# Patient Record
Sex: Female | Born: 1935 | Race: White | Hispanic: No
Health system: Southern US, Community
[De-identification: ages and names within clinical notes are randomized; demographics above are authoritative.]

---

## 2006-02-17 ENCOUNTER — Ambulatory Visit: Payer: Self-pay | Admitting: Unknown Physician Specialty

## 2006-05-26 ENCOUNTER — Ambulatory Visit: Payer: Self-pay | Admitting: Unknown Physician Specialty

## 2008-04-04 ENCOUNTER — Ambulatory Visit: Payer: Self-pay | Admitting: Unknown Physician Specialty

## 2014-01-25 ENCOUNTER — Inpatient Hospital Stay: Payer: Self-pay | Admitting: Internal Medicine

## 2014-01-25 LAB — CBC WITH DIFFERENTIAL/PLATELET
HCT: 35.8 % (ref 35.0–47.0)
HGB: 11.7 g/dL — ABNORMAL LOW (ref 12.0–16.0)
MCH: 29.4 pg (ref 26.0–34.0)
MCHC: 32.7 g/dL (ref 32.0–36.0)
MCV: 90 fL (ref 80–100)
PLATELETS: 227 10*3/uL (ref 150–440)
RBC: 3.98 10*6/uL (ref 3.80–5.20)
RDW: 13.9 % (ref 11.5–14.5)
WBC: 14.6 10*3/uL — AB (ref 3.6–11.0)

## 2014-01-25 LAB — BASIC METABOLIC PANEL
ANION GAP: 12 (ref 7–16)
BUN: 36 mg/dL — ABNORMAL HIGH (ref 7–18)
CREATININE: 2.16 mg/dL — AB (ref 0.60–1.30)
Calcium, Total: 8.7 mg/dL (ref 8.5–10.1)
Chloride: 108 mmol/L — ABNORMAL HIGH (ref 98–107)
Co2: 20 mmol/L — ABNORMAL LOW (ref 21–32)
EGFR (Non-African Amer.): 23 — ABNORMAL LOW
GFR CALC AF AMER: 28 — AB
Glucose: 152 mg/dL — ABNORMAL HIGH (ref 65–99)
OSMOLALITY: 291 (ref 275–301)
Potassium: 3.6 mmol/L (ref 3.5–5.1)
Sodium: 140 mmol/L (ref 136–145)

## 2014-01-25 LAB — URINALYSIS, COMPLETE
BILIRUBIN, UR: NEGATIVE
BLOOD: NEGATIVE
Bacteria: NONE SEEN
Glucose,UR: NEGATIVE mg/dL (ref 0–75)
Hyaline Cast: 11
Ketone: NEGATIVE
NITRITE: NEGATIVE
PH: 5 (ref 4.5–8.0)
PROTEIN: NEGATIVE
Specific Gravity: 1.02 (ref 1.003–1.030)
Squamous Epithelial: 7
WBC UR: 74 /HPF (ref 0–5)

## 2014-01-25 LAB — TROPONIN I

## 2014-01-25 LAB — HEPATIC FUNCTION PANEL A (ARMC)
ALBUMIN: 2.8 g/dL — AB (ref 3.4–5.0)
ALK PHOS: 73 U/L
ALT: 16 U/L
BILIRUBIN TOTAL: 0.5 mg/dL (ref 0.2–1.0)
Bilirubin, Direct: <0.1 mg/dL (ref 0.0–0.2)
SGOT(AST): 19 U/L (ref 15–37)
Total Protein: 6.5 g/dL (ref 6.4–8.2)

## 2014-01-26 LAB — CBC WITH DIFFERENTIAL/PLATELET
Basophil #: 0.1 10*3/uL (ref 0.0–0.1)
Basophil %: 0.5 %
Eosinophil #: 0.3 10*3/uL (ref 0.0–0.7)
Eosinophil %: 2.3 %
HCT: 30.9 % — AB (ref 35.0–47.0)
HGB: 10.1 g/dL — ABNORMAL LOW (ref 12.0–16.0)
LYMPHS PCT: 11 %
Lymphocyte #: 1.6 10*3/uL (ref 1.0–3.6)
MCH: 29 pg (ref 26.0–34.0)
MCHC: 32.7 g/dL (ref 32.0–36.0)
MCV: 89 fL (ref 80–100)
Monocyte #: 1.4 x10 3/mm — ABNORMAL HIGH (ref 0.2–0.9)
Monocyte %: 9.7 %
NEUTROS ABS: 10.9 10*3/uL — AB (ref 1.4–6.5)
Neutrophil %: 76.5 %
Platelet: 216 10*3/uL (ref 150–440)
RBC: 3.48 10*6/uL — AB (ref 3.80–5.20)
RDW: 13.7 % (ref 11.5–14.5)
WBC: 14.3 10*3/uL — AB (ref 3.6–11.0)

## 2014-01-26 LAB — COMPREHENSIVE METABOLIC PANEL
ALK PHOS: 58 U/L
AST: 14 U/L — AB (ref 15–37)
Albumin: 2.4 g/dL — ABNORMAL LOW (ref 3.4–5.0)
Anion Gap: 11 (ref 7–16)
BUN: 29 mg/dL — ABNORMAL HIGH (ref 7–18)
Bilirubin,Total: 0.3 mg/dL (ref 0.2–1.0)
CALCIUM: 7.5 mg/dL — AB (ref 8.5–10.1)
Chloride: 108 mmol/L — ABNORMAL HIGH (ref 98–107)
Co2: 21 mmol/L (ref 21–32)
Creatinine: 1.69 mg/dL — ABNORMAL HIGH (ref 0.60–1.30)
EGFR (African American): 37 — ABNORMAL LOW
GFR CALC NON AF AMER: 30 — AB
Glucose: 122 mg/dL — ABNORMAL HIGH (ref 65–99)
OSMOLALITY: 287 (ref 275–301)
POTASSIUM: 3.3 mmol/L — AB (ref 3.5–5.1)
SGPT (ALT): 11 U/L — ABNORMAL LOW
Sodium: 140 mmol/L (ref 136–145)
Total Protein: 5.4 g/dL — ABNORMAL LOW (ref 6.4–8.2)

## 2014-01-26 LAB — LIPID PANEL
CHOLESTEROL: 116 mg/dL (ref 0–200)
HDL Cholesterol: 27 mg/dL — ABNORMAL LOW (ref 40–60)
Ldl Cholesterol, Calc: 49 mg/dL (ref 0–100)
TRIGLYCERIDES: 199 mg/dL (ref 0–200)
VLDL Cholesterol, Calc: 40 mg/dL (ref 5–40)

## 2014-01-26 LAB — TSH: Thyroid Stimulating Horm: 1.58 u[IU]/mL

## 2014-01-26 LAB — MAGNESIUM: Magnesium: 1.4 mg/dL — ABNORMAL LOW

## 2014-01-26 LAB — HEMOGLOBIN A1C: Hemoglobin A1C: 8.2 % — ABNORMAL HIGH (ref 4.2–6.3)

## 2014-01-27 LAB — BASIC METABOLIC PANEL
Anion Gap: 8 (ref 7–16)
BUN: 22 mg/dL — ABNORMAL HIGH (ref 7–18)
Calcium, Total: 7.7 mg/dL — ABNORMAL LOW (ref 8.5–10.1)
Chloride: 114 mmol/L — ABNORMAL HIGH (ref 98–107)
Co2: 20 mmol/L — ABNORMAL LOW (ref 21–32)
Creatinine: 1.41 mg/dL — ABNORMAL HIGH (ref 0.60–1.30)
EGFR (Non-African Amer.): 37 — ABNORMAL LOW
GFR CALC AF AMER: 45 — AB
Glucose: 161 mg/dL — ABNORMAL HIGH (ref 65–99)
Osmolality: 290 (ref 275–301)
Potassium: 4.1 mmol/L (ref 3.5–5.1)
Sodium: 142 mmol/L (ref 136–145)

## 2014-01-27 LAB — URINE CULTURE

## 2014-01-28 ENCOUNTER — Encounter: Payer: Self-pay | Admitting: Internal Medicine

## 2014-01-28 LAB — BASIC METABOLIC PANEL
Anion Gap: 7 (ref 7–16)
BUN: 15 mg/dL (ref 7–18)
CALCIUM: 7.9 mg/dL — AB (ref 8.5–10.1)
Chloride: 115 mmol/L — ABNORMAL HIGH (ref 98–107)
Co2: 20 mmol/L — ABNORMAL LOW (ref 21–32)
Creatinine: 1.38 mg/dL — ABNORMAL HIGH (ref 0.60–1.30)
EGFR (African American): 47 — ABNORMAL LOW
EGFR (Non-African Amer.): 38 — ABNORMAL LOW
GLUCOSE: 251 mg/dL — AB (ref 65–99)
OSMOLALITY: 292 (ref 275–301)
POTASSIUM: 4.5 mmol/L (ref 3.5–5.1)
Sodium: 142 mmol/L (ref 136–145)

## 2014-02-13 LAB — COMPREHENSIVE METABOLIC PANEL
ALT: 17 U/L (ref 14–63)
Albumin: 2.1 g/dL — ABNORMAL LOW (ref 3.4–5.0)
Alkaline Phosphatase: 105 U/L (ref 46–116)
Anion Gap: 11 (ref 7–16)
BUN: 21 mg/dL — ABNORMAL HIGH (ref 7–18)
Bilirubin,Total: 0.4 mg/dL (ref 0.2–1.0)
CHLORIDE: 100 mmol/L (ref 98–107)
CO2: 24 mmol/L (ref 21–32)
CREATININE: 1.54 mg/dL — AB (ref 0.60–1.30)
Calcium, Total: 8.6 mg/dL (ref 8.5–10.1)
GFR CALC AF AMER: 41 — AB
GFR CALC NON AF AMER: 34 — AB
Glucose: 167 mg/dL — ABNORMAL HIGH (ref 65–99)
Osmolality: 277 (ref 275–301)
Potassium: 3.9 mmol/L (ref 3.5–5.1)
SGOT(AST): 30 U/L (ref 15–37)
Sodium: 135 mmol/L — ABNORMAL LOW (ref 136–145)
Total Protein: 6 g/dL — ABNORMAL LOW (ref 6.4–8.2)

## 2014-02-13 LAB — CBC WITH DIFFERENTIAL/PLATELET
BASOS ABS: 0.2 10*3/uL — AB (ref 0.0–0.1)
Basophil %: 0.6 %
Eosinophil #: 0.3 10*3/uL (ref 0.0–0.7)
Eosinophil %: 1 %
HCT: 32.5 % — ABNORMAL LOW (ref 35.0–47.0)
HGB: 10.6 g/dL — ABNORMAL LOW (ref 12.0–16.0)
Lymphocyte #: 1.2 10*3/uL (ref 1.0–3.6)
Lymphocyte %: 4.1 %
MCH: 28.1 pg (ref 26.0–34.0)
MCHC: 32.6 g/dL (ref 32.0–36.0)
MCV: 86 fL (ref 80–100)
Monocyte #: 2 x10 3/mm — ABNORMAL HIGH (ref 0.2–0.9)
Monocyte %: 6.5 %
NEUTROS ABS: 26.8 10*3/uL — AB (ref 1.4–6.5)
NEUTROS PCT: 87.8 %
PLATELETS: 278 10*3/uL (ref 150–440)
RBC: 3.77 10*6/uL — AB (ref 3.80–5.20)
RDW: 14.3 % (ref 11.5–14.5)
WBC: 30.6 10*3/uL — ABNORMAL HIGH (ref 3.6–11.0)

## 2014-02-13 LAB — URINALYSIS, COMPLETE
Bilirubin,UR: NEGATIVE
Blood: NEGATIVE
Glucose,UR: NEGATIVE mg/dL (ref 0–75)
Ketone: NEGATIVE
Nitrite: NEGATIVE
PH: 5 (ref 4.5–8.0)
Protein: NEGATIVE
RBC,UR: 21 /HPF (ref 0–5)
Specific Gravity: 1.018 (ref 1.003–1.030)
Squamous Epithelial: NONE SEEN
WBC UR: 13 /HPF (ref 0–5)

## 2014-02-13 LAB — TSH: Thyroid Stimulating Horm: 3.65 u[IU]/mL

## 2014-02-13 LAB — MAGNESIUM: Magnesium: 1.9 mg/dL

## 2014-02-15 LAB — URINE CULTURE

## 2014-02-18 ENCOUNTER — Encounter: Payer: Self-pay | Admitting: Internal Medicine

## 2014-02-18 ENCOUNTER — Ambulatory Visit: Payer: Self-pay | Admitting: Internal Medicine

## 2014-02-19 ENCOUNTER — Inpatient Hospital Stay: Payer: Self-pay | Admitting: Internal Medicine

## 2014-02-19 LAB — CBC WITH DIFFERENTIAL/PLATELET
BASOS ABS: 0.1 10*3/uL (ref 0.0–0.1)
Basophil %: 0.5 %
Eosinophil #: 0.3 10*3/uL (ref 0.0–0.7)
Eosinophil %: 1.1 %
HCT: 29.9 % — ABNORMAL LOW (ref 35.0–47.0)
HGB: 9.7 g/dL — ABNORMAL LOW (ref 12.0–16.0)
Lymphocyte #: 0.8 10*3/uL — ABNORMAL LOW (ref 1.0–3.6)
Lymphocyte %: 2.7 %
MCH: 27.6 pg (ref 26.0–34.0)
MCHC: 32.5 g/dL (ref 32.0–36.0)
MCV: 85 fL (ref 80–100)
MONO ABS: 1.8 x10 3/mm — AB (ref 0.2–0.9)
Monocyte %: 5.8 %
Neutrophil #: 27.2 10*3/uL — ABNORMAL HIGH (ref 1.4–6.5)
Neutrophil %: 89.9 %
PLATELETS: 247 10*3/uL (ref 150–440)
RBC: 3.53 10*6/uL — ABNORMAL LOW (ref 3.80–5.20)
RDW: 14.6 % — ABNORMAL HIGH (ref 11.5–14.5)
WBC: 30.3 10*3/uL — AB (ref 3.6–11.0)

## 2014-02-19 LAB — BASIC METABOLIC PANEL
Anion Gap: 10 (ref 7–16)
BUN: 29 mg/dL — ABNORMAL HIGH (ref 7–18)
CALCIUM: 8.2 mg/dL — AB (ref 8.5–10.1)
CO2: 23 mmol/L (ref 21–32)
Chloride: 104 mmol/L (ref 98–107)
Creatinine: 1.33 mg/dL — ABNORMAL HIGH (ref 0.60–1.30)
EGFR (African American): 49 — ABNORMAL LOW
GFR CALC NON AF AMER: 40 — AB
GLUCOSE: 226 mg/dL — AB (ref 65–99)
Osmolality: 287 (ref 275–301)
Potassium: 3.5 mmol/L (ref 3.5–5.1)
SODIUM: 137 mmol/L (ref 136–145)

## 2014-02-19 LAB — PROTIME-INR
INR: 1.2
PROTHROMBIN TIME: 15.6 s — AB

## 2014-02-21 LAB — CBC WITH DIFFERENTIAL/PLATELET
BASOS ABS: 0.2 10*3/uL — AB (ref 0.0–0.1)
Basophil %: 0.8 %
Eosinophil #: 0.4 10*3/uL (ref 0.0–0.7)
Eosinophil %: 1.3 %
HCT: 29.1 % — ABNORMAL LOW (ref 35.0–47.0)
HGB: 9.3 g/dL — ABNORMAL LOW (ref 12.0–16.0)
Lymphocyte #: 0.8 10*3/uL — ABNORMAL LOW (ref 1.0–3.6)
Lymphocyte %: 2.5 %
MCH: 27.3 pg (ref 26.0–34.0)
MCHC: 32 g/dL (ref 32.0–36.0)
MCV: 85 fL (ref 80–100)
Monocyte #: 1.9 x10 3/mm — ABNORMAL HIGH (ref 0.2–0.9)
Monocyte %: 6.1 %
NEUTROS PCT: 89.3 %
Neutrophil #: 28.2 10*3/uL — ABNORMAL HIGH (ref 1.4–6.5)
PLATELETS: 270 10*3/uL (ref 150–440)
RBC: 3.4 10*6/uL — ABNORMAL LOW (ref 3.80–5.20)
RDW: 14.5 % (ref 11.5–14.5)
WBC: 31.6 10*3/uL — AB (ref 3.6–11.0)

## 2014-02-21 LAB — BASIC METABOLIC PANEL
Anion Gap: 8 (ref 7–16)
BUN: 18 mg/dL (ref 7–18)
CHLORIDE: 107 mmol/L (ref 98–107)
Calcium, Total: 8.2 mg/dL — ABNORMAL LOW (ref 8.5–10.1)
Co2: 24 mmol/L (ref 21–32)
Creatinine: 1.12 mg/dL (ref 0.60–1.30)
EGFR (African American): 59 — ABNORMAL LOW
EGFR (Non-African Amer.): 49 — ABNORMAL LOW
GLUCOSE: 90 mg/dL (ref 65–99)
OSMOLALITY: 279 (ref 275–301)
POTASSIUM: 3.4 mmol/L — AB (ref 3.5–5.1)
Sodium: 139 mmol/L (ref 136–145)

## 2014-02-21 LAB — PROTEIN, BODY FLUID: Protein, Body Fluid: 2.6 g/dL

## 2014-02-21 LAB — LACTATE DEHYDROGENASE, PLEURAL OR PERITONEAL FLUID: LDH, BODY FLUID: 192 U/L

## 2014-02-21 LAB — GLUCOSE, SEROUS FLUID: Glucose, Body Fluid: 196 mg/dL

## 2014-02-21 LAB — ALBUMIN: Albumin: 1.5 g/dL — ABNORMAL LOW (ref 3.4–5.0)

## 2014-02-21 LAB — ALBUMIN, FLUID (OTHER): Body Fluid Albumin: 1.3 g/dL

## 2014-02-21 LAB — LACTATE DEHYDROGENASE: LDH: 369 U/L — ABNORMAL HIGH (ref 81–246)

## 2014-02-22 LAB — CBC WITH DIFFERENTIAL/PLATELET
Bands: 1 %
HCT: 31.5 % — ABNORMAL LOW (ref 35.0–47.0)
HGB: 9.9 g/dL — ABNORMAL LOW (ref 12.0–16.0)
Lymphocytes: 1 %
MCH: 27.1 pg (ref 26.0–34.0)
MCHC: 31.5 g/dL — AB (ref 32.0–36.0)
MCV: 86 fL (ref 80–100)
Monocytes: 4 %
Platelet: 315 10*3/uL (ref 150–440)
RBC: 3.66 10*6/uL — AB (ref 3.80–5.20)
RDW: 15 % — ABNORMAL HIGH (ref 11.5–14.5)
SEGMENTED NEUTROPHILS: 94 %
WBC: 39 10*3/uL — ABNORMAL HIGH (ref 3.6–11.0)

## 2014-02-23 LAB — WBC: WBC: 37.5 10*3/uL — ABNORMAL HIGH (ref 3.6–11.0)

## 2014-02-23 LAB — HEPATIC FUNCTION PANEL A (ARMC)
ALBUMIN: 1.6 g/dL — AB (ref 3.4–5.0)
ALT: 12 U/L — AB (ref 14–63)
Alkaline Phosphatase: 123 U/L — ABNORMAL HIGH (ref 46–116)
BILIRUBIN TOTAL: 0.2 mg/dL (ref 0.2–1.0)
Bilirubin, Direct: <0.1 mg/dL (ref 0.0–0.2)
SGOT(AST): 24 U/L (ref 15–37)
Total Protein: 5.2 g/dL — ABNORMAL LOW (ref 6.4–8.2)

## 2014-02-24 LAB — CLOSTRIDIUM DIFFICILE(ARMC)

## 2014-02-24 LAB — CULTURE, BLOOD (SINGLE)

## 2014-02-25 LAB — CEA: CEA: 12.1 ng/mL — ABNORMAL HIGH (ref 0.0–4.7)

## 2014-02-25 LAB — BODY FLUID CULTURE

## 2014-03-19 ENCOUNTER — Ambulatory Visit
Admit: 2014-03-19 | Disposition: A | Discharge status: Home / Self Care | Payer: Self-pay | Attending: Internal Medicine | Admitting: Internal Medicine

## 2014-03-19 ENCOUNTER — Encounter
Admit: 2014-03-19 | Disposition: A | Discharge status: Home / Self Care | Payer: Self-pay | Attending: Internal Medicine | Admitting: Internal Medicine

## 2014-03-19 DEATH — deceased

## 2014-04-19 ENCOUNTER — Encounter
Admit: 2014-04-19 | Disposition: A | Discharge status: Home / Self Care | Payer: Self-pay | Attending: Internal Medicine | Admitting: Internal Medicine

## 2014-05-19 NOTE — Consult Note (Signed)
PATIENT NAME:  Amanda [name], BOKHARI MR#:  [id number] DATE OF BIRTH:  [birth year]  DATE OF CONSULTATION:  02/22/2014  CONSULTING PHYSICIAN:  Simonne Come. Inez Pilgrim, MD  Amanda [name] is an 81 year old patient who was admitted on February 2 and seen and evaluated by me on February 5. Consultation done on that day discussed with medicine at that time, met with a family member at that time, and had written note placed on the chart at that time, although this full narrative reflecting the events of that day is rendered after a delay.   Amanda [name] had recently been admitted January 8 to January 11 for urinary tract infection, acute renal failure. She has a past history that includes dementia, diabetes, and hypertension. She was in Dr. Marella Bile office for increasing shortness of breath and a recent CT showed a large left-sided pleural effusion with a left perihilar mass. The patient had complained of fatigue. Due to dementia, the patient has not been able to give much of her history, otherwise provided by physicians and family members. The patient again was admitted on February 2 and plans were for thoracentesis on the left side, pulmonary consultation. The patient had leukocytosis and was treated for potential postobstructive pneumonia, recent acute on chronic renal failure, was given IV fluids and monitoring of diabetes with sliding scale insulin. She was continued on other regular medicines for cholesterol and for dementia.   MEDICATIONS AT THE TIME OF ADMISSION: Vitamin D and vitamin B12, raloxifene 60 mg p.o. daily, omeprazole 20 daily, Namenda 14 mg daily, magnesium 400 mg b.i.d., Januvia  50 mg in the evening, glimepiride 4 mg daily, escitalopram 5 mg daily,  atorvastatin 80 mg daily, amlodipine 5 mg daily. Medication taken from the admission physician's list.   SOCIAL HISTORY: Was at [facility] in rehabilitation. History of smoking, pack-years were unknown.   FAMILY HISTORY: Noncontributory.   REVIEW OF SYSTEMS: The  patient did state that she had some left-sided chest pain and indicated that area where she had a previous thoracentesis on February 3. She denied chest pain. Denied headache or abdominal pain. She admitted to being short of breath. Denied vomiting, but said she had loose stool.   PHYSICAL EXAMINATION: GENERAL: She was lethargic, alert, had memory lapses, could answer the questions as above, could not give an account for her current difficulties.  HEENT: Sclerae, no jaundice. Mouth, no thrush.  LUNGS: Decreased air entry in both bases. No wheezing.  HEART: Regular.  ABDOMEN: Nontender. No palpable mass or organomegaly.  NEUROLOGIC: Mental status as above, not grossly focal. Moving all extremities. I did not test the gait.  EXTREMITIES: Bilateral symmetric pitting edema.   LABORATORY DATA: On February 2 admission white count was 30,000, 27,000 neutrophils, hemoglobin 9.7, platelets 247,000. The creatinine was 1.33, the calcium was 8.2. Pleural fluid cytology was pending at that time, but later, from February 2 sample, was negative for malignant cells. A February 4 LDH was 369. February 5 repeat white count was up to 39,000, hemoglobin 9.9, platelets 315,000. Liver chemistries were ordered for the following day. Later, returned with hypoalbuminemia of 1.6. Otherwise unremarkable liver function tests.  IMAGING: A CT scan had been reported, scan done on February 1 without contrast, it showed suspicion for a left hilar central mass with obstruction and collapse of left upper lobe, a lot of pleural fluid, partially loculated anteriorly, small right effusion, 8 mm right lower lobe pulmonary nodule, and a nonspecific 19 mm right adrenal nodule as well. Bulky mediastinal adenopathy.  CT scan from January 8 showed a 2.3 cm right renal lesion with retroperitoneal hemorrhage and also showed the same right lower lobe 8 mm lung nodule.   IMPRESSION AND PLAN: The patient with a lung mass that may be matted nodes,  highly suspicious for possibly lymphoma or lung cancer. Other histologies are not ruled out. There is a   renal mass that may be incidental, a second primary, not the usual pattern for metastatic renal cancer, although there was 1 isolated pulmonary nodule. The patient has pleural effusion that was initially not obviously malignant. It may postobstructive pneumonia. There is collapsed lung. There is a leukocytosis that is probably reactive or due to infection or leukemoid reaction, less likely would be myeloproliferative disease or chronic myelogenous leukemia. There is anemia, likely directly related to malignancy, and to azotemia, possibly contribution. Certainly could be other causes, underlying iron deficiency though this is not the pattern of a gastrointestinal malignancy. The patient's condition is poor. She is oxygen-dependent and there is an underlying dementia that has limited her ability to participate in her own decision-making. I spoke to the patient's son at the bedside about all these issues. Essentially, there is still appropriate therapies and the potential for some successful palliation and with lung cancer, for small cell cancer, we can give less than full dose therapy and expect a very good response even in some very ill patients.  Lymphoma is highly treatable, even if the patient could not take full course of therapy, could not effect a cure, the potential to palliate with very moderate doses and steroids exist. The patient's son at this time wanted to pursue a tissue diagnosis later depending on the results to decide if given the patient's comorbidities, he would want to pursue aggressive therapy. Therefore, the plan was pending repeat thoracentesis or bronchoscopy versus CT-guided biopsy would be pursued. After tissue diagnosis, re-evaluate the potential therapies. I ordered a  CLL panel. Watch serial CBC and chemistries. A CEA was checked.    ____________________________ Simonne Come Inez Pilgrim,  MD rgg:LT D: 03/17/2014 17:25:00 ET T: 03/17/2014 20:32:26 ET JOB#: [id number]  cc: Simonne Come. Inez Pilgrim, MD, <Dictator> Dallas Schimke MD ELECTRONICALLY SIGNED 03/26/2014 21:48

## 2014-05-19 NOTE — Consult Note (Signed)
Comments   I met with pt's daughter. She and her brother have decided for pt to go to the [facility] for end of life care. Ginny Ward, RN, liason for the [facility] notified and will see pt in AM.   Electronic Signatures: Felesia Stahlecker, Izora Gala (MD)  (Signed 11-Feb-16 15:42)  Authored: Palliative Care   Last Updated: 11-Feb-16 15:42 by Rakim Moone, Izora Gala (MD)

## 2014-05-19 NOTE — H&P (Signed)
PATIENT NAME:  Amanda [name], [name] MR#:  [id number] DATE OF BIRTH:  [birth year]  DATE OF ADMISSION:  01/25/2014  PRIMARY CARE PHYSICIAN: Elnita Maxwellheryl L. Lin GivensJeffries, MD.  REFERRING EMERGENCY ROOM PHYSICIAN: Governor Rooksebecca Lord, MD.  CHIEF COMPLAINT: Generalized weakness, diarrhea, urinary frequency.   HISTORY OF PRESENT ILLNESS: The patient is an 81 year old, pleasant, Caucasian female with a past medical history of diabetes mellitus and hyperlipidemia, who is coming into the ED with a chief complaint of generalized weakness. Her UA resulted with 3+ leukocyte esterase. WBC 14.6 and creatinine is 2.16. The patient was complaining of intermittent episodes of diarrhea, regarding which a CAT scan of the abdomen was ordered, which has revealed right upper pole renal mass with a 2.3 cm hematoma and pulmonary nodule. Urine cultures were obtained. The patient was started on IV Rocephin and the hospitalist team is called to admit the patient. During my examination, the patient is resting comfortable, denies any abdominal pain and she reports no diarrhea. She has chronic history of diarrhea intermittently. She gets 2 or 3 loose bowel movements and after that it will be resolved spontaneously and she is not concerned. Son is at bedside. She denies any back pain flank pain. No similar complaints in the past.   PAST MEDICAL HISTORY: Diabetes mellitus, hyperlipidemia.   PAST SURGICAL HISTORY: None.   ALLERGIES: No known drug allergies.   PSYCHOSOCIAL HISTORY: Lives at home alone. No smoking, alcohol or illicit drug usage.   FAMILY HISTORY: Diabetes runs in her family.   HOME MEDICATIONS: Vitamin D2 - 50,000 international units 1 capsule p.o. once a week, vitamin B12 - 500 mcg once daily, raloxifene 60 mg 1 tablet p.o. once daily, omeprazole 20 mg capsule once daily, Namenda 40 mg p.o. once daily, lisinopril 10 mg p.o. once daily, Januvia 50 mg 1 tablet p.o. once daily, glimepiride 4 mg p.o. once daily, fish oil 1000 mg 1 capsule  p.o. once daily, escitalopram 5 mg once daily, doxycycline 1 capsule p.o. once daily, donepezil 10 mg p.o. once daily, atorvastatin 80 mg p.o. once a day, aspirin 81 mg p.o. once a day.  REVIEW OF SYSTEMS:  CONSTITUTIONAL: Denies any fever or fatigue. Complaining of weakness. Denies any weight loss or weight gain.   EYES: Denies blurry vision, double vision.   ENT: Denies epistaxis, discharge.   RESPIRATORY: Denies cough, COPD.   CARDIOVASCULAR: No chest pain or palpitations.   GASTROINTESTINAL: Denies jaundice. No nausea, vomiting. Had 2 to 3 loose bowel movements which is her normal. Denies any rectal bleeding.   GENITOURINARY: Complaining of dysuria. No hematuria. Denies any renal calculi or urinary frequency. Denies any breast mass or vaginal discharge.   ENDOCRINE: Denies polyuria, nocturia. Has diabetes mellitus. No thyroid problems.   HEMATOLOGIC: No any anemia, easy bruising or bleeding.   INTEGUMENT: Denies acne, rash, lesions.   MUSCULOSKELETAL: No joint pain in her neck, back, shoulders. Denies any gout.   NEUROLOGIC: Denies vertigo, ataxia. No dementia.  PSYCHIATRIC: No ADD or OCD.  PHYSICAL EXAMINATION: VITAL SIGNS: Temperature 98.2, pulse 75, respirations 20, blood pressure is 101/50, pulse oximetry 97%.   GENERAL APPEARANCE: Not in any acute distress. Moderately built and nourished.   HEENT: Normocephalic, atraumatic. Pupils are equally reacting to light and accommodation. No scleral icterus. No conjunctival injection. No sinus tenderness. No postnasal drip. Moist mucous membranes.   NECK: Supple. No JVD. No thyromegaly. Range of motion is intact.   LUNGS: Clear to auscultation bilaterally. No accessory muscle use and no anterior chest wall  tenderness on palpation.   CARDIAC: S1, S2 normal. Regular rate and rhythm. No murmurs.   GASTROINTESTINAL: Soft. Bowel sounds are present in all 4 quadrants. Nontender, nondistended. No masses felt. No flank  tenderness.  NEUROLOGIC: Awake, alert and oriented x 3. Cranial nerves 2 through 12 is grossly intact. Motor and sensory are intact. Reflexes are 2+.   EXTREMITIES: No edema. No cyanosis. No clubbing.   SKIN: Warm to touch. Normal turgor. No rashes. No lesions.   MUSCULOSKELETAL: No joint effusion.   PSYCHIATRIC: Normal mood and affect.   LABORATORY AND IMAGING STUDIES: Total protein 6.5, albumin 2.8. The rest of the LFTs are normal. Troponin less than 0.02. WBC 14.6, hemoglobin 11.7, hematocrit 35.8, platelets are 227,000. Urinalysis: Urine is cloudy in appearance. Leukocyte esterase 3+, nitrites negative, hyaline casts are present. BMP: Glucose 152, BUN 36, creatinine 2.16. We do not know her baseline creatinine as no previous lab results are present at [facility]. Sodium 140, potassium 3.6, chloride 108, CO2 of 20. GFR 23. Anion gap is 12. Serum osmolality and calcium are normal.   A 12-lead EKG: Normal sinus rhythm at 69 beats per minute. CAT scan of the abdomen and pelvis without contrast reveals a retroperitoneal hemorrhage adjacent to a 2.3 cm upper pole right renal lesion. Further evaluation, postcontrast MRI or CT should be considered. A 0.8 mm right lower lobe pulmonary nodule, consider CT chest. Atherosclerosis including aortoiliac and coronary artery disease, consider assessment for potential risk factor modification. Dietary therapy and pharmacological therapy may be warranted if clinically indicated.   ASSESSMENT AND PLAN: An 81 year old, Caucasian female brought into the emergency department for generalized weakness, diagnosed with urinary tract infection and right upper pole kidney mass with hematoma.  1. Generalized weakness, probably from acute cystitis. Urine cultures are ordered. The patient will be given IV Rocephin and she will be placed on IV fluids.  2. Acute kidney injury. The patient denies any chronic renal insufficiency. This can be prerenal with a component of renal. I will  give her gentle hydration with IV fluids. Avoid nephrotoxins, hold lisinopril and diabetic medications. Consult is placed to Dr. Wynelle Link.  3. Right upper pole kidney mass and hematoma 2.3 cm. We will get MRI of the abdomen for further evaluation of the right upper pole kidney mass with hematoma. If needed, we will consider urology consult. The patient denies any flank pain, weight loss or weight gain.  4. Right lower lung pulmonary nodule. The patient needs to get a CT of the chest, which can be done as an outpatient.  5. Diabetes mellitus. The patient will be on sliding scale insulin. We will get hemoglobin A1c. She seems to be noncompliant. Will hold off on her home medications at this time.  6. Hyperlipidemia. Continue statin.  7. We will provide gastrointestinal prophylaxis and deep vein thrombosis prophylaxis with sequential compression devices in view of right upper pole right upper pole kidney mass with hematoma.  8. The diagnosis and plan of care was discussed in detail with the patient and her son at bedside. They both verbalized understanding of the plan.   Total time spent is 50 minutes.     ____________________________ Ramonita Lab, MD ag:TT D: 01/25/2014 18:06:34 ET T: 01/25/2014 19:06:51 ET JOB#: [id number]  cc: Ramonita Lab, MD, <Dictator> Ramonita Lab MD ELECTRONICALLY SIGNED 02/02/2014 15:19

## 2014-05-19 NOTE — Discharge Summary (Signed)
PATIENT NAME:  Amanda [name] MR#:  [id number] DATE OF BIRTH:  11-Aug-1924  DATE OF ADMISSION:  01/25/2014 DATE OF DISCHARGE:  01/28/2014  PRESENTING COMPLAINT: Increasing weakness.   DISCHARGE DIAGNOSES: 1.  Acute renal failure, appears acute on chronic renal failure.  2.  Urinary tract infection.  3.  Right upper pole 2.3 cm renal mass. Work-up needs to be done as outpatient.  4.  Hypertension.  5.  Dementia.  6.  Incidental note of right lower lobe 8 mm lung nodule.  CONDITION ON DISCHARGE: Fair.   CODE STATUS: FULL code.   DISCHARGE DIET: 2 grams sodium diet, ADA 1800 calorie diet.   DISCHARGE MEDICATIONS: 1.  Vitamin B12 500 mcg p.o. daily. 2.  Escitalopram 5 mg daily.  3.  Fish oil 1000 mg daily.  4.  Donepezil 10 mg daily.  5.  Januvia 50 mg p.o. daily.  6.  Namenda XR 14 mg p.o. daily.  7.  Glimepiride 4 mg daily.  8.  Atorvastatin 80 mg daily.  9.  Omeprazole 20 mg p.o. daily.  10.  Vitamin D2 50,000 international units p.o. once a week on Sunday.  11.  Raloxifene 60 mg p.o. daily.  12.  Amlodipine 5 mg daily.  13.  Keflex 500 mg b.i.d.  14.  Mag-Ox 400 mg b.i.d. for 5 days.  NOTE:  1.  Lisinopril has been discontinued.  2.  Aspirin has been held since the patient has some hemorrhagic area around the right renal upper pole mass.   DISCHARGE INSTRUCTIONS:  1.  [facility] Lateef[facility] to 2 weeks for acute on chronic kidney disease along with right upper pole renal mass.  2.  Follow up with Dr. Harrington Challengerhies after rehab is completed to get CT scan of the lungs to follow up on the lung nodule and hospital follow-up.   CONSULTANTS: Renal, Dr. Wynelle LinkKolluru.   DIAGNOSTIC DATA: Labs at discharge: Creatinine is 1.38. potassium is 4.1. Chloride is 114. White count is 14.3. H and H is 10.1 and 30.9. Lipid profile within normal limits. Hemoglobin A1c is 8.2. UA positive for UTI. Urine culture: 50,000 colonies of Streptococcus  agalactiae. Creatinine on admission was 2.16.  BRIEF SUMMARY OF HOSPITAL COURSE: Amanda [name] is a pleasant 81 year old Caucasian female with hypertension, diabetes, and dementia who comes in with: 1.  Generalized weakness from probably acute cystitis and renal failure. Her urine cultures grew Streptococcus agalactiae. She was started on IV Rocephin, will change to p.o. Keflex for the next couple of days.  2.  Acute kidney injury, likely has CKD at baseline. Given her IV fluids, held her lisinopril, consulted Dr. Wynelle LinkKolluru. Creatinine came down from 2.16 to 1.3.  3.  Right upper pole mass with hematoma, 2.3 cm. MRI of the abdomen will be done at a later date. Given elevated creatinine, not advisable to give IV contrast. Family is aware and will get the patient to follow up with nephrology once she is out of rehab.  4.  Right lower lobe pulmonary nodule. CT of the lungs will be done as outpatient. We will defer this to primary care physician, Dr. Harrington Challengerhies. The patient's son is aware about it. 5.  Type 2 diabetes. Resumed glimepiride and Januvia.  6.  Hyperlipidemia. Continued statins.  7.  DVT prophylaxis. SCDs and TEDs. Antiplatelets were avoided given hematoma of the right upper pole kidney.  Hospital stay otherwise remained stable. Physical therapy recommended rehab. The patient is  going to [facility] today. She remained a FULL code.   TIME SPENT: 40 minutes.   ____________________________ Wylie Hail Allena Katz, MD sap:sb D: 01/28/2014 11:19:42 ET T: 01/28/2014 11:48:36 ET JOB#: [id number]  cc: Jastin Fore A. Allena Katz, MD, <Dictator> Willow Ora MD ELECTRONICALLY SIGNED 01/31/2014 11:20

## 2014-05-19 NOTE — Discharge Summary (Signed)
PATIENT NAME:  Amanda Amanda [name], Amanda Amanda [name] MR#:  [id number] DATE OF BIRTH:  [birth year]  DATE OF ADMISSION:  02/19/2014 DATE OF DISCHARGE:  03/01/2014  PRIMARY CARE PHYSICIAN:  Dr. Harrington Challengerhies.    FINAL DIAGNOSES:  1.  Acute respiratory failure.  2.  Left lung mass with pleural effusion with Amanda [name] reaccumulation over Amanda [name] short period of time, leukocytosis and hilar adenopathy.  3.  Possible postobstructive pneumonia.  4.  Chronic kidney disease.  5.  Hypertension.  6.  Diabetes.  7.  Dementia.    MEDICATIONS ON DISCHARGE TO THE HOSPICE HOME: Include Lexapro 5 mg daily, morphine 20 mg/mL 0.25 mL every 2 hours as needed for shortness of breath, nystatin 1000 units mL, 5 mL orally q. 6 hours, DuoNeb nebulizer solution 3 mL inhaled 4 times Amanda [name] day as needed for shortness of breath, Glucerna shake 230 mL 3 times Amanda [name] day, lorazepam 0.5 mg orally every 4 hours as needed for agitation. All of her other medications were stopped.     HOSPITAL COURSE:  The patient was admitted February 2 with increasing shortness of breath, abnormal CAT scan, found to have left-sided pleural effusion, left perihilar mass, leukocytosis. The patient was started on antibiotics.   LABORATORY AND RADIOLOGICAL DATA DURING THE HOSPITAL COURSE: Included creatinine of 1.33, BUN 24, sodium 137, potassium 3.5, chloride 104, CO2 of 23, calcium 8.2, INR 1.2. White blood count 30.3, H and H 9.7 and 29.9, platelet count of 247,000. Pleural fluid cytology negative, no organisms seen, pH body fluid 7.2, LDH 192, glucose 196, albumin 1.3 in the body fluid, protein in the body fluid of 2.6. The patient had Amanda [name] thoracentesis on February 2 that drew off 850 mL. Blood cultures were negative. CEA was elevated at 12.1. Stool for Clostridium difficile was negative. The patient had another thoracentesis on February 8 that drew off another 800 mL. Creatinine upon discharge 1.57. White count upon discharge 40.2, hemoglobin 9.9.   CONSULTANTS DURING THE HOSPITAL COURSE: Included Dr.  Welton FlakesKhan pulmonology, then Dr. Belia HemanKasa pulmonology, physical therapy, Dr. Lorre NickGittin oncology, and  palliative care team.    1.  The patient was made Amanda [name] DNR. Decision was made not to proceed with tissue diagnosis and comfort care measures at the hospice home were pursued. It took Amanda [name] long time to make that decision, but it was done on 03/01/2014. The patient was transferred to the hospice home.  2.  The patient for acute respiratory failure was on 6 liters nasal cannula the entire hospital stay, had 2 thoracenteses drawing out 850 mL and then 800 mL.  3.  For Amanda [name] left lung mass with pleural effusion that reaccumulated over Amanda [name] short period of time, leukocytosis, and hilar adenopathy, likely this is Amanda [name] lung cancer, but also could be Amanda [name] lymphoma. Overall prognosis poor. The patient was Amanda [name] DNR and transferred to the hospice home.  4.  Possible postobstructive pneumonia. The patient was on antibiotics during the hospital course.   5.  For chronic kidney disease stage III, no change over the hospital course.  6.  For hypertension, medications were stopped upon discharge to the hospice home.  7.  For her type 2 diabetes, medications were stopped on transfer to the hospice home. Sugars were high secondary to the steroids.  8.  Dementia. Medications were stopped on the hospice home.   TIME SPENT ON DISCHARGE: 35 minutes.     ____________________________ Herschell Dimesichard J. Renae GlossWieting, MD rjw:bu D: 03/01/2014 15:33:30 ET T: 03/01/2014 15:52:54 ET JOB#: [id number]  cc: Gerlene Burdockichard  Jeanella Anton, MD, <Dictator> Neomia Dear. Harrington Challenger, MD Salley Scarlet MD ELECTRONICALLY SIGNED 03/11/2014 10:18

## 2014-05-19 NOTE — Consult Note (Signed)
Brief Consult Note: Diagnosis: lung mass/adenopathy, renal cystic mass, retroperitoneal hemorrhage, anemia, sever hypoalbuminemia, dementia.   Patient was seen by consultant.   Orders entered.   Comments: DICTATED NOTE TO FOLLOW PATIENT SEEN DISCUSSED WITH SON.  BRIEFLY..LUNG CANCER OR LYMPHOMA MOST LIKELY. CURRENT PLAN FOR DX, BRONCHOSCOPY. ADDITIONAL IMAGING, POSSIBLE PET/CT OR MRI BRAIN DEPENDING ON DX, AND IF PATIENT AND FAMILY WANT TO PURSUE TX.  IF NO RESP DISTRESS, WOULD REDUCE OR D/C STEROIDS, AS MAY INTERFERE WITH DIASGNOSIS OF LYMPHOMA.  Electronic Signatures: Marin RobertsGittin, Robert G (MD)  (Signed [phone]) [phone]-Feb-16 20:24)  Authored: Brief Consult Note   Last Updated: 05-Feb-16 20:24 by Marin RobertsGittin, Robert G (MD)

## 2014-05-19 NOTE — H&P (Signed)
PATIENT NAME:  Amanda [name], Amanda [name] MR#:  [id number] DATE OF BIRTH:  02-Jul-1924  DATE OF ADMISSION:  02/19/2014  PRIMARY CARE PHYSICIAN:  Neomia Dearavid N. Harrington Challengerhies, MD  PRIMARY PULMONOLOGIST:  Yevonne PaxSaadat [name]. Khan, MD  CHIEF COMPLAINT: Increasing shortness of breath and abnormal CT scan.   HISTORY OF PRESENT ILLNESS: History is obtained from the patient's daughter and son along with Dr. Welton FlakesKhan. The patient is not able to provide much history secondary to her underlying dementia.   Amanda [name] is an 81 year old Caucasian female with past medical history of hypertension, dementia, was recently admitted 01/25/2014, [facility].  Come in from Dr. Milta DeitersKhan's office for increasing shortness of breath and recent CT of the chest done which shows large malignant left-sided pleural effusion with possible left perihilar mass. The patient is, herself, not able to provide much history. She only states that she is tired and fatigued. Her daughter tells me that patient has been [facility], she had received some IV fluids for symptoms of dehydration and she has been progressively declining with poor p.o. intake and feeling weak, fatigued, and shortness of breath. [name] followup chest [facility] and it showed pleural effusion and, hence, she was referred to Dr. Welton FlakesKhan who did [name] CT of the chest which shows large malignant pleural effusion. The patient is being admitted for further evaluation and management.   PAST MEDICAL HISTORY: 1.  Dementia.  2.  Recent admission for acute renal failure and UTI. 3.  Diabetes.  4.  Hypertension.   CODE STATUS: Full code. This was discussed with the patient's daughter and son.   CURRENT MEDICATIONS: 1.  Vitamin D2, 50,000 International Units p.o. once [name] week.  2.  Vitamin B12, 500 mcg p.o. daily.  3.  Raloxifene 60 mg p.o. daily.  4.  Omeprazole 20 mg daily.  5.  Namenda XR 14 mg p.o. daily.  6.  Magnesium oxide 400 mg b.i.d.  7.  Januvia  50 mg p.o. daily in the evening.  8.  Glimepiride 4 mg p.o. daily.  9.  Fish oil 1000 mg p.o. daily.  10.  Escitalopram 5 mg p.o. daily.  11.  Donepezil 10 mg p.o. daily.  12.  Atorvastatin 80 mg daily.  13.  Amlodipine 5 mg daily.   SOCIAL HISTORY: She is currently at rehabilitation at [name].  She is an ex-smoker according of her daughter. Further details not known.   PAST SURGICAL HISTORY: None.   FAMILY HISTORY: Diabetes runs in the family.   REVIEW OF SYSTEMS:  Limited secondary to patient's dementia.  CONSTITUTIONAL: Positive for some fatigue and weakness.  EYES: No blurred or double vision, glaucoma, or cataracts.  EARS, NOSE, THROAT: No tinnitus, ear pain, ear discharge.  RESPIRATORY: Positive for shortness of breath, orthopnea, and PND.  CARDIOVASCULAR: No chest pain. Positive for leg edema. Positive for hypertension and.  GASTROINTESTINAL: No nausea, vomiting, or abdominal pain. Positive for diarrhea.  GENITOURINARY: No dysuria, hematuria, frequency.  ENDOCRINE: No polyuria, nocturia, or thyroid problems.  HEMATOLOGY: No anemia or easy bruising.  SKIN: No acne or rash.  MUSCULOSKELETAL: No swelling or gout.  NEUROLOGIC: Positive for dementia. No CVA, TIA, or seizures.  PSYCHIATRIC: No anxiety or depression. All other systems reviewed and negative.   PHYSICAL EXAMINATION: GENERAL: The patient is alert. She is oriented x 2. She appears very weak and frail.  VITAL SIGNS:  Afebrile. Pulse is 106. Blood pressure is 161/73. Saturations are 92% at  rest.  HEENT: Atraumatic, normocephalic. PERRLA, EOM intact. Oral mucosa is dry.   NECK: Supple. No JVD. No carotid bruit.  RESPIRATORY: Decreased breath sounds at the bases, more on the left than the right, no audible rales or any respiratory distress.  CARDIOVASCULAR: Both heart sounds are normal. Rate, rhythm regular. PMI not lateralized. Chest nontender.  EXTREMITIES: There is 2+ pitting edema. Feeble pedal pulses, good femoral  pulses.  ABDOMEN: Soft, benign, nontender. No organomegaly. NEUROLOGIC: Exam limited, but appears grossly nonfocal. Mood, affect normal.  PSYCHIATRIC: The patient is awake. She is alert and oriented x 2.  SKIN:  Warm and dry.  LABORATORY DATA: White count is 30,000, H and H is 9.7 and 29.9, platelet count is 247,000. PT/INR is 15.6 and 1.2.  IMAGING: CT of the chest without contrast done on February 1 shows [name] large left pleural effusion, partially loculated, over the anterior left upper lobes. Small right pleural effusion. There is collapse of the left upper lobe with concern for central left hilar mass, difficult to evaluate with collapsed lung and lack of IV contrast. There is bulky mediastinal lymphadenopathy. [name] left   large pretracheal and left paratracheal nodal mass, it measures about 4.7 x 3.7 noted. Chest wall tissues are unremarkable. Pulmonary nodule in the right lower lobe measures 8 mm. Heart size is normal. Suspicion for left hilar central mass with obstruction and collapse of the left upper lobe, large left pleural effusion, partially loculated, anterior in the left upper hemithorax.  Bulky mediastinal lymphadenopathy. [name] 19 mm right adrenal nodule, nonspecific, cannot exclude metastasis, severe coronary artery disease.   ASSESSMENT: Amanda [name], an 81 year old, with history hypertension, diabetes, and dementia, comes in with: 1.  Large malignant left-sided pleural effusion with left perihilar mass, likely malignancy. The patient is going to be admitted to the medical floor. Will give oxygen. We will request interventional radiology to do therapeutic and diagnostic left-sided thoracentesis. Pulmonary consultation with Dr. Welton Flakes. Case was discussed with him. Further workup regarding left perihilar mass per Dr. Welton Flakes.  2.  Leukocytosis, with significant left pleural effusion and hilar mass. Could be [name] postobstructive pneumonia. I will cover her with antibiotics with intravenous Rocephin and  Zithromax. Will send sputum culture if patient able to give [name] sample. I will also send in blood cultures. Follow up leukocyte count. 3.  Acute on chronic renal insufficiency. The patient's creatinine is still pending. She was recently admitted with dehydration and had received fluid. Her last creatinine was 1.5. Will await metabolic panel, continue intravenous fluids for now. We will continue normal saline at maintenance rate for now.  4.  Hypertension. Will resume her amlodipine.  5.  Type 2 diabetes. Continue sliding scale for now. The patient has been having poor p.o. intake. We will resume her diabetic medications as her blood sugars are managed and according to her p.o. intake.  6.  Gastroesophageal reflux disease. Continue omeprazole. 7.  Dementia. Continue Namenda and donepezil.  8.  Hyperlipidemia, on statins.  9.  Deep vein thrombosis prophylaxis. Subcutaneous heparin t.i.d.  10.  Further workup according to patient's clinical course. CODE STATUS was addressed with patient's daughter and son. They want her to be full code for now. Consider palliative care consultation down the road. All the questions were answered.   TIME SPENT: 50 minutes.    ____________________________ Wylie Hail Allena Katz, MD sap:LT D: 02/19/2014 15:54:25 ET T: 02/19/2014 16:17:56 ET JOB#: [id number]  cc: Yailene Badia [name]. Allena Katz, MD, <Dictator> [place] Goguen [name] Lubertha Leite  MD ELECTRONICALLY SIGNED 03/19/2014 17:29

## 2014-05-19 NOTE — Consult Note (Signed)
Chief Complaint:  Subjective/Chief Complaint sitting up in chair seems to be comfortable. Had thoracentesis done awaiting cytology   VITAL SIGNS/ANCILLARY NOTES: **Vital Signs.:   03-Feb-16 13:13  Vital Signs Type Routine  Temperature Temperature (F) 98.7  Celsius 37  Temperature Source oral  Pulse Pulse 89  Respirations Respirations 20  Systolic BP Systolic BP 062  Diastolic BP (mmHg) Diastolic BP (mmHg) 67  Mean BP 83  Pulse Ox % Pulse Ox % 95  Pulse Ox Activity Level  At rest  Oxygen Delivery 2L   Brief Assessment:  GEN well nourished, no acute distress   Cardiac Regular  --Rub  --Gallop   Respiratory normal resp effort  no use of accessory muscles  diminished on left chest   Gastrointestinal details normal Soft  Nontender  Nondistended  No masses palpable   EXTR negative cyanosis/clubbing   Lab Results: Routine Chem:  02-Feb-16 14:45   Glucose, Serum  226  BUN  29  Creatinine (comp)  1.33  Sodium, Serum 137  Potassium, Serum 3.5  Chloride, Serum 104  CO2, Serum 23  Calcium (Total), Serum  8.2  Anion Gap 10  Osmolality (calc) 287  eGFR (African American)  49  eGFR (Non-African American)  40 (eGFR values <47m/min/1.73 m2 may be an indication of chronic kidney disease (CKD). Calculated eGFR, using the MRDR Study equation, is useful in  patients with stable renal function. The eGFR calculation will not be reliable in acutely ill patients when serum creatinine is changing rapidly. It is not useful in patients on dialysis. The eGFR calculation may not be applicable to patients at the low and high extremes of body sizes, pregnant women, and vegetarians.)  Routine Coag:  02-Feb-16 14:45   Prothrombin  15.6 (11.4-15.0 NOTE: New Reference Range  02/15/14)  INR 1.2 (INR reference interval applies to patients on anticoagulant therapy. A single INR therapeutic range for coumarins is not optimal for all indications; however, the suggested range for most  indications is 2.0 - 3.0. Exceptions to the INR Reference Range may include: Prosthetic heart valves, acute myocardial infarction, prevention of myocardial infarction, and combinations of aspirin and anticoagulant. The need for a higher or lower target INR must be assessed individually. Reference: The Pharmacology and Management of the Vitamin K  antagonists: the seventh ACCP Conference on Antithrombotic and Thrombolytic Therapy. CBJSEG.3151Sept:126 (3suppl): [id number] A HCT value >55% may artifactually increase the PT.  In one study,  the increase was an average of 25%. Reference:  "Effect on Routine and Special Coagulation Testing Values of Citrate Anticoagulant Adjustment in Patients with High HCT Values." American Journal of Clinical Pathology 2006;126:400-405.)  Routine Hem:  02-Feb-16 14:45   WBC (CBC)  30.3  RBC (CBC)  3.53  Hemoglobin (CBC)  9.7  Hematocrit (CBC)  29.9  Platelet Count (CBC) 247  MCV 85  MCH 27.6  MCHC 32.5  RDW  14.6  Neutrophil % 89.9  Lymphocyte % 2.7  Monocyte % 5.8  Eosinophil % 1.1  Basophil % 0.5  Neutrophil #  27.2  Lymphocyte #  0.8  Monocyte #  1.8  Eosinophil # 0.3  Basophil # 0.1 (Result(s) reported on 19 Feb 2014 at 03:16PM.)   Radiology Results: XRay:    02-Feb-16 16:53, Chest 1 View for Post Thoracentesis  Chest 1 View for Post Thoracentesis   REASON FOR EXAM:    post thoracentesis  COMMENTS:       PROCEDURE: DXR - [place]ST [place]  Feb 19 2014  4:53PM     CLINICAL DATA:  81 year old female with a history of a left pleural  effusion and atelectasis.    EXAM:  CHEST XRAY 1 VIEW    COMPARISON:  CT 02/18/2014    FINDINGS:  Cardiomediastinal silhouette within normal limits in size and  contour, though partially obscured by overlying lung/ pleural  disease.    Wedge-shaped opacity of the left lung extends from the hilum to the  pleura. Evidence of retained fluid at the apex of the left lung.    Trace fluid at  the left base in the costophrenic angle. No  visualized pneumothorax.    Trace right-sided pleural effusion.    Right lung well aerated.    Degenerative changes of thebilateral shoulders and cervical spine.   IMPRESSION:  Status post left-sided diagnostic and therapeutic thoracentesis,  there is no visualized pneumothorax with trace fluid at the left  base.    Wedge-shaped opacity at the superior left lung, likelya combination  of atelectatic lung, loculated fluid at the apex, and soft tissue at  the hilum suspicious for primary mass, as was described on prior CT.    Trace right pleural fluid.    Signed,    Dulcy Fanny. Earleen Newport, DO  [facility]    [facility]      Electronically Signed    By: Corrie Mckusick D.O.    On: 02/19/2014 17:11         Verified By: Johny Shears, M.D.,  [place]:    02-Feb-16 16:54, US Guide Thoracentesis Left  US Guide Thoracentesis Left   REASON FOR EXAM:    large left plueral effusion with mass  COMMENTS:       PROCEDURE: [place]  - US GUIDED THORACENTESIS LEFT  - Feb 19 2014  4:54PM     CLINICAL DATA:  81 year old female with a history of left-sided  pleural effusion. She has been referred for a diagnostic and  therapeutic left thoracentesis.    EXAM:  ULTRASOUND GUIDED LEFT THORACENTESIS    COMPARISON:  CT 02/18/2014    PROCEDURE:  An ultrasound guided thoracentesis was thoroughly discussed with the  patient and questions answered. The benefits, risks, alternatives  and complications were also discussed. The patient understands and  wishes to proceed with the procedure. Written consent was obtained.    Ultrasound was performed to localize and mark an adequate pocket of  fluid inthe left lower chest. The area was then prepped and draped  in the normal sterile fashion. 1% Lidocaine was used for local  anesthesia. Under ultrasound guidance a Safe-T-Centesis catheter was  introduced. Thoracentesis  was performed. The catheter was removed  and a dressing applied.    Patient tolerated the procedure well and remained hemodynamically  stable throughout.  850 cc of thin yellow fluid removed.    No complications were encountered. No significant blood loss  encounter.    COMPLICATIONS:  None    FINDINGS:  A total of approximately 850 of thin yellow fluid was removed. A  fluid sample wassent for laboratory analysis.     IMPRESSION:  Status post ultrasound-guided left diagnostic and therapeutic  thoracentesis with 850 cc of thin yellow fluid removed. Sample was  sent to the lab for complete analysis.    Signed,    Dulcy Fanny. Earleen Newport, DO    [facility]    [facility]      Electronically Signed  By: Corrie Mckusick D.O.    On: 02/19/2014 17:13     Verified By: Johny Shears, M.D.,   Assessment/Plan:  Assessment/Plan:  Assessment 1. Lung Mass -await cytology on pleural fluid -will do bronch if cytology is negative  2. Pleural effusion -suspect malignancy -await cytology   Electronic Signatures: Allyne Gee (MD)  (Signed 03-Feb-16 18:20)  Authored: Chief Complaint, VITAL SIGNS/ANCILLARY NOTES, Brief Assessment, Lab Results, Radiology Results, Assessment/Plan   Last Updated: 03-Feb-16 18:20 by Allyne Gee (MD)

## 2014-05-19 NOTE — Consult Note (Signed)
   Comments   Billey Chang, NP, and I met with pt's daughter and son. Son has spoken with Dr Cynda Acres and understands that pt may be a candidate for treatment. They have also spoken with Dr Mortimer Fries and understand that pt is high risk for a diagnostic procedure. We discussed the option of no treatment with family. At family's request, we then spoke with pt in the presence of her family. Family were hopeful that pt could make her own decision. However, pt is unable to grasp or retain the information I tried to give her.  asked for time to consider options. We will follow up tomorrow at their request.  discussed code status with family. Pt has a living will and family agree with DNR.   Electronic Signatures: Jjesus Dingley, Izora Gala (MD)  (Signed 09-Feb-16 17:22)  Authored: Palliative Care   Last Updated: 09-Feb-16 17:22 by Sherrill Buikema, Izora Gala (MD)

## 2016-04-03 IMAGING — CT CT CHEST W/O CM
2 of 3 series · 15 of 36 positions shown, 18 images · non-contrast
Comparison: CT of the abdomen and pelvis 01/25/2014.

CLINICAL DATA: Pulmonary nodule.  Evaluate for pleural effusion.

EXAM:
CT CHEST WITHOUT CONTRAST
TECHNIQUE: Multidetector CT imaging of the chest was performed following the
standard protocol without IV contrast..

[Series 2: routine chest wo · axial · 0.64mm/px · z∈[-274,-54]mm · 12 of 52 slices shown, 15 images]
[im 4/52  mediastinal]
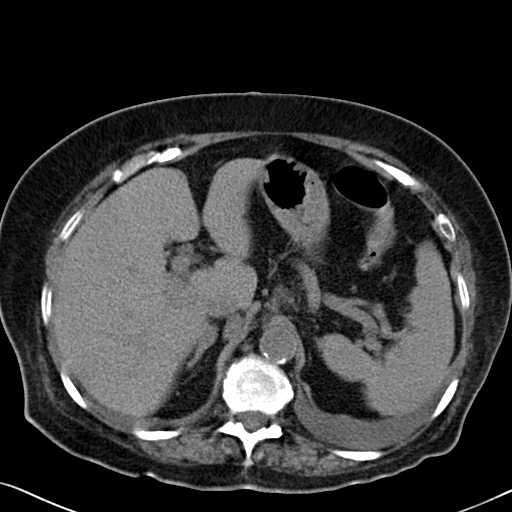
[im 4/52  lung]
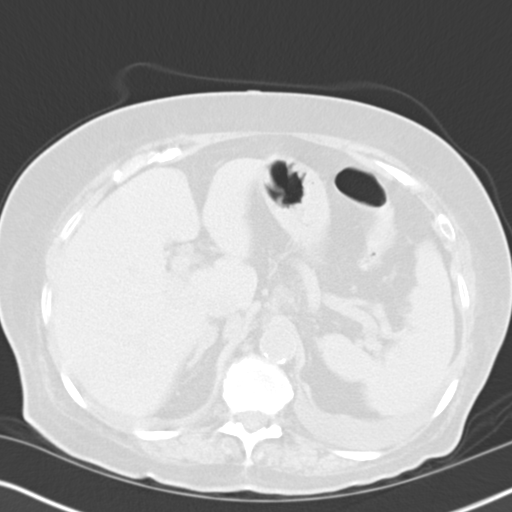
[im 8/52  lung]
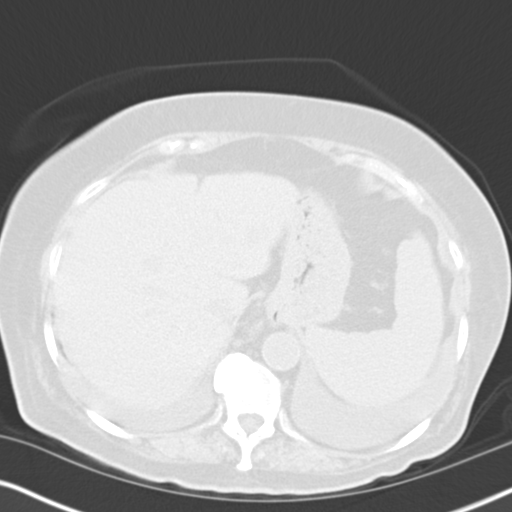
[im 12/52  lung]
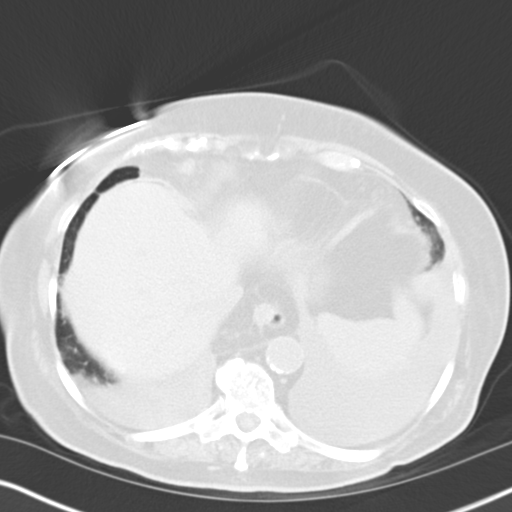
[im 16/52  lung]
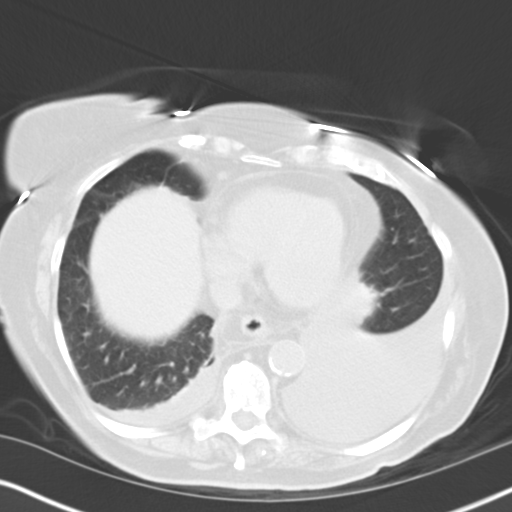
[im 19/52  mediastinal]
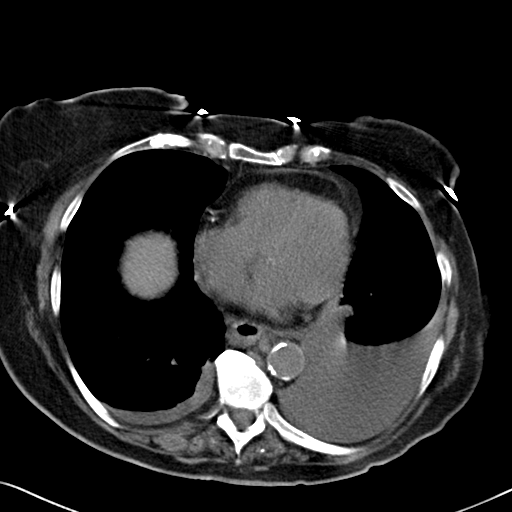
[im 19/52  lung]
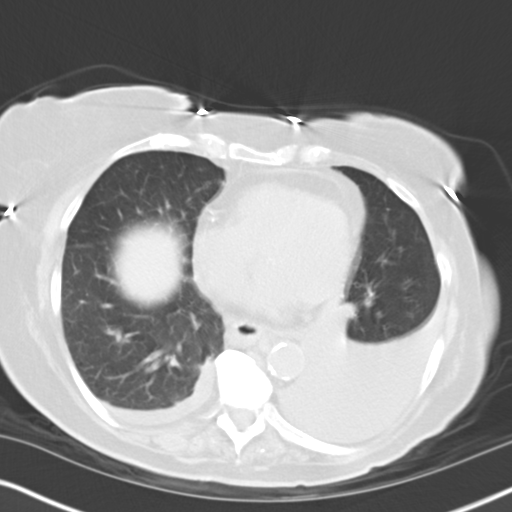
[im 23/52  lung]
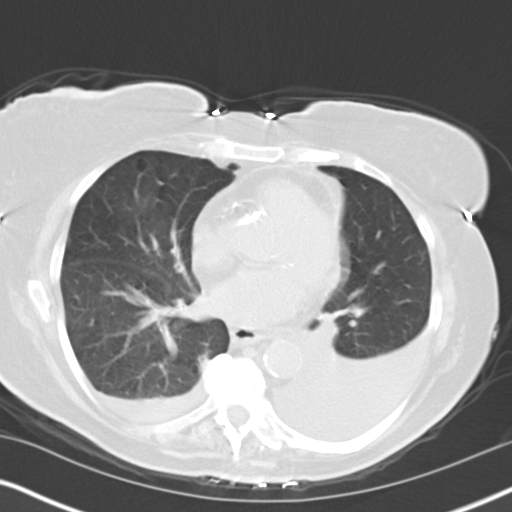
[im 29/52  lung]
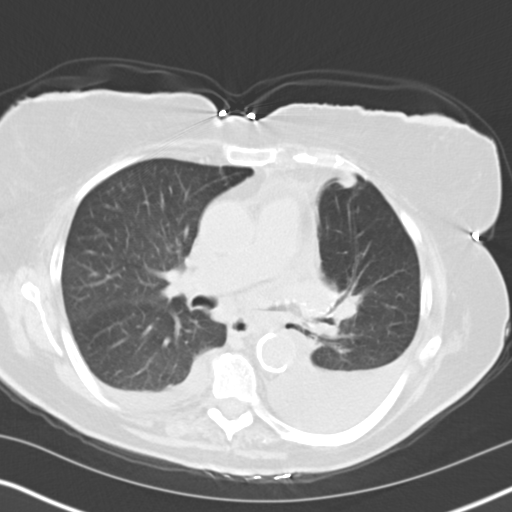
[im 33/52  lung]
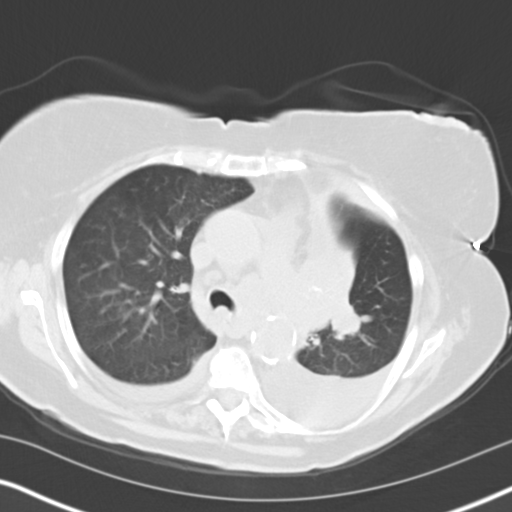
[im 36/52  mediastinal]
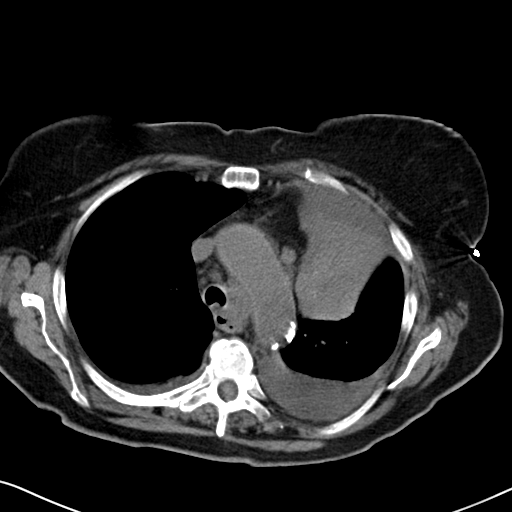
[im 36/52  lung]
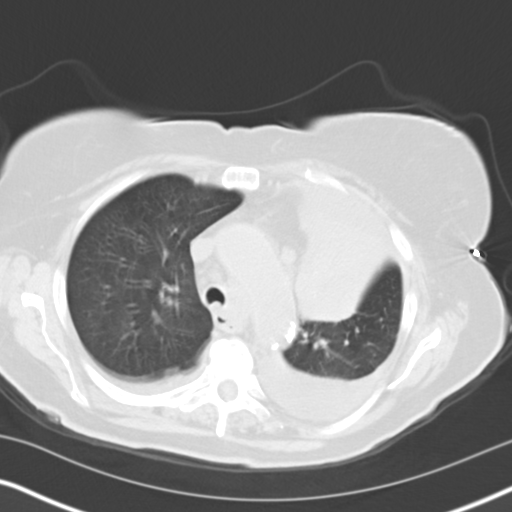
[im 40/52  lung]
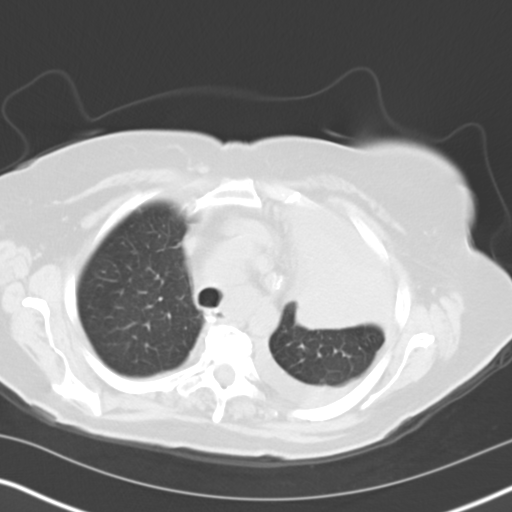
[im 44/52  lung]
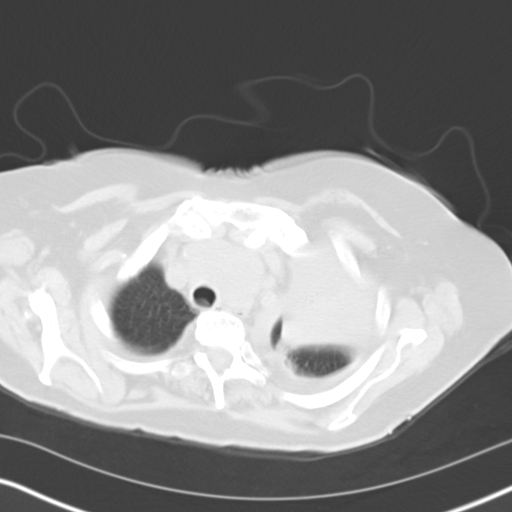
[im 48/52  lung]
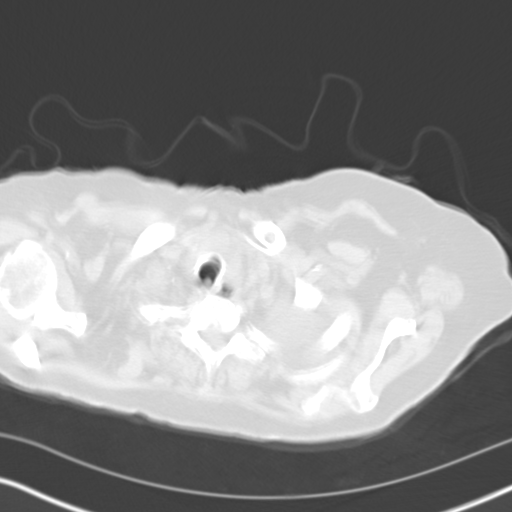

[Series 5: cor routine chest wo · coronal · 0.53mm/px · 3 of 130 slices shown]
[im 26/130  lung]
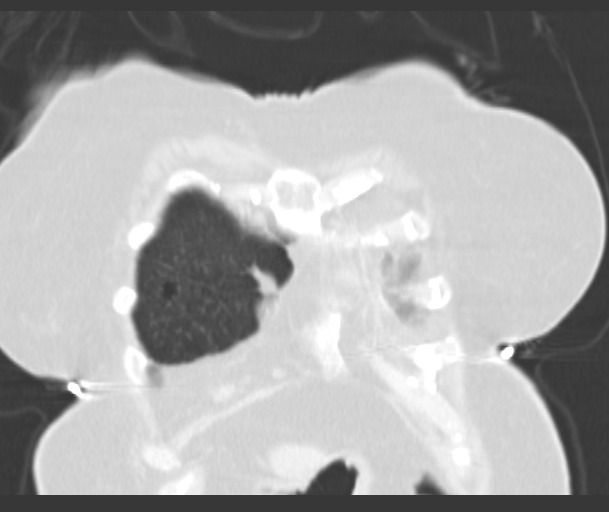
[im 52/130  lung]
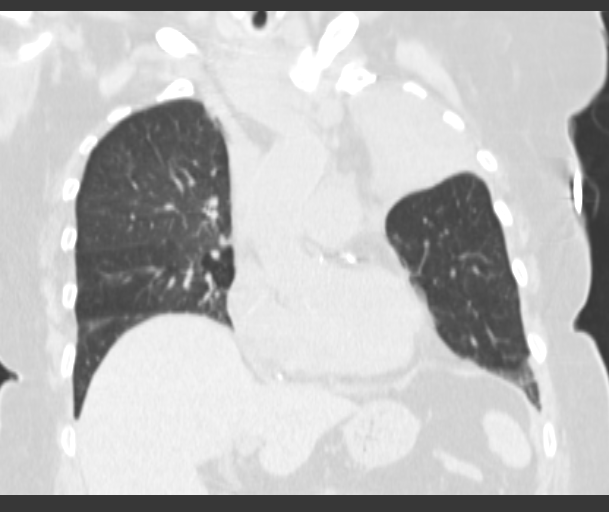
[im 78/130  lung]
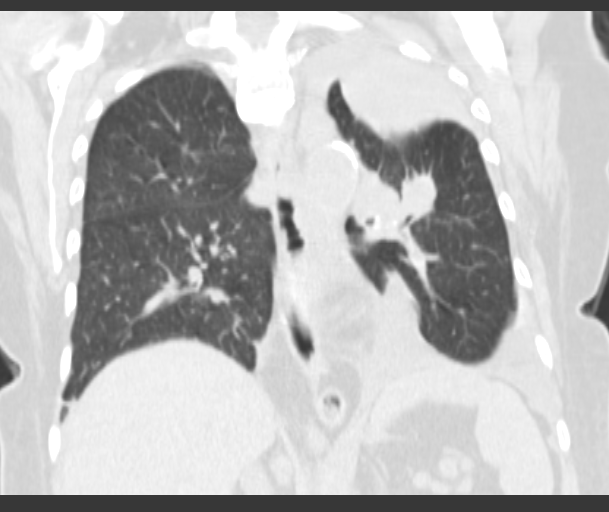

[15 of 36 positions shown; findings below may reference images not displayed]

FINDINGS: There is a large left pleural effusion, partially loculated over the
anterior left upper lobe. Small right pleural effusion. There is
collapse of the left upper lobe with concern for central left hilar
mass, difficult to evaluate with the collapsed lung an lack of
intravenous contrast. There is bulky mediastinal adenopathy. Index
large pretracheal and left paratracheal conglomerate nodal mass on
image 9 measures 4.7 x 3.4 cm. AP window adenopathy also noted,
difficult to measure due to adjacent left hilar mass and collapse
left upper lobe. Subcarinal lymph node has a short axis diameter of
14 mm.

Pulmonary nodule in the right lower lobe measures 8 mm on image 28.
Heart is normal size. Densely calcified coronary arteries. Scattered
aortic calcification without aneurysm.

Chest wall soft tissues are unremarkable.

19 mm nodule in the right adrenal gland. Prior cholecystectomy. No
acute findings in the upper abdomen.

No acute bony abnormality or focal bone lesion.
IMPRESSION: Suspicion for a left hilar/ central mass with obstruction and
collapse of much of the left upper lobe. Large left pleural
effusion, partially loculated anteriorly in the left upper
hemithorax.

Small right pleural effusion. 8 mm right lower lobe pulmonary
nodule. Cannot exclude metastasis or second primary.

Bulky mediastinal adenopathy.

19 mm right adrenal nodule, nonspecific.  Cannot exclude metastasis.

Severe coronary artery disease.
